# Patient Record
Sex: Male | Born: 1995 | Race: Black or African American | Hispanic: No | Marital: Single | Smoking: Current every day smoker
Health system: Southern US, Community
[De-identification: ages and names within clinical notes are randomized; demographics above are authoritative.]

---

## 2018-03-05 ENCOUNTER — Emergency Department: Payer: Self-pay

## 2018-03-05 ENCOUNTER — Emergency Department
Admission: EM | Admit: 2018-03-05 | Discharge: 2018-03-05 | Disposition: A | Discharge status: Home / Self Care | Payer: Self-pay | Attending: Emergency Medicine | Admitting: Emergency Medicine

## 2018-03-05 ENCOUNTER — Other Ambulatory Visit: Payer: Self-pay

## 2018-03-05 DIAGNOSIS — Y998 Other external cause status: Secondary | ICD-10-CM | POA: Insufficient documentation

## 2018-03-05 DIAGNOSIS — F17200 Nicotine dependence, unspecified, uncomplicated: Secondary | ICD-10-CM | POA: Insufficient documentation

## 2018-03-05 DIAGNOSIS — W109XXA Fall (on) (from) unspecified stairs and steps, initial encounter: Secondary | ICD-10-CM | POA: Insufficient documentation

## 2018-03-05 DIAGNOSIS — Y929 Unspecified place or not applicable: Secondary | ICD-10-CM | POA: Insufficient documentation

## 2018-03-05 DIAGNOSIS — Y9389 Activity, other specified: Secondary | ICD-10-CM | POA: Insufficient documentation

## 2018-03-05 DIAGNOSIS — S99921A Unspecified injury of right foot, initial encounter: Secondary | ICD-10-CM | POA: Insufficient documentation

## 2018-03-05 MED ORDER — KETOROLAC TROMETHAMINE 30 MG/ML IJ SOLN
30.0000 mg | Freq: Once | INTRAMUSCULAR | Status: AC
  Administered 2018-03-05: 30 mg via INTRAMUSCULAR
  Filled 2018-03-05: qty 1

## 2018-03-05 MED ORDER — IBUPROFEN 600 MG PO TABS: 600.0000 mg | ORAL_TABLET | Freq: Four times a day (QID) | ORAL | 0 refills | Status: DC | PRN

## 2018-03-05 NOTE — ED Triage Notes (Signed)
Pt c/o falling downstairs yesterday. Denies LOC. C/o right foot pain

## 2018-03-05 NOTE — ED Provider Notes (Signed)
[facility] Emergency Department Provider Note  ____________________________________________  Time seen: Approximately 2:07 PM  I have reviewed the triage vital signs and the nursing notes.   HISTORY  Chief Complaint Foot Pain    HPI Duane Newton is a 22 y.o. male presents emergency department for evaluation of right ankle pain after fall last night.  Patient states that he missed a step and fell down a couple of stairs.  He landed on his knees.  His right foot went backwards.  He is having pain over the top and side of his ankle.  He has been walking with a boot.  No additional injuries or concerns.   History reviewed. No pertinent past medical history.  There are no active problems to display for this patient.   History reviewed. No pertinent surgical history.  Prior to Admission medications   Medication Sig Start Date End Date Taking? Authorizing Provider  ibuprofen (ADVIL,MOTRIN) 600 MG tablet Take 1 tablet (600 mg total) by mouth every 6 (six) hours as needed. 03/05/18   Enid Derry, PA-C    Allergies Patient has no known allergies.  No family history on file.  Social History Social History   Tobacco Use  . Smoking status: Current Every Day Smoker  Substance Use Topics  . Alcohol use: Yes  . Drug use: Yes    Types: Marijuana     Review of Systems  Constitutional: No fever/chills Cardiovascular: No chest pain. Respiratory:  No SOB. Gastrointestinal: No abdominal pain.  No nausea, no vomiting.  Musculoskeletal: Positive for foot pain.  Skin: Negative for rash, abrasions, lacerations, ecchymosis.  ____________________________________________   PHYSICAL EXAM:  VITAL SIGNS: ED Triage Vitals [03/05/18 1208]  Enc Vitals Group     BP 124/66     Pulse Rate (!) 59     Resp 18     Temp 98.4 F (36.9 C)     Temp src      SpO2 99 %     Weight 165 lb (74.8 kg)     Height 6\' 2"  (1.88 m)     Head Circumference      Peak  Flow      Pain Score 7     Pain Loc      Pain Edu?      Excl. in GC?      Constitutional: Alert and oriented. Well appearing and in no acute distress. Eyes: Conjunctivae are normal. PERRL. EOMI. Head: Atraumatic. ENT:      Ears:      Nose: No congestion/rhinnorhea.      Mouth/Throat: Mucous membranes are moist.  Neck: No stridor. Cardiovascular: Normal rate, regular rhythm.  Good peripheral circulation. Respiratory: Normal respiratory effort without tachypnea or retractions. Lungs CTAB. Good air entry to the bases with no decreased or absent breath sounds. Musculoskeletal: Full range of motion to all extremities. No gross deformities appreciated.  Tenderness to palpation over Neurologic:  Normal speech and language. No gross focal neurologic deficits are appreciated.  Skin:  Skin is warm, dry and intact. No rash noted. Psychiatric: Mood and affect are normal. Speech and behavior are normal. Patient exhibits appropriate insight and judgement.   ____________________________________________   LABS (all labs ordered are listed, but only abnormal results are displayed)  Labs Reviewed - No data to display ____________________________________________  EKG   ____________________________________________  RADIOLOGY Lexine Baton, personally viewed and evaluated these images (plain radiographs) as part of my medical decision making, as well as reviewing the written  report by the radiologist.  Dg Ankle Complete Right  Result Date: 03/05/2018 CLINICAL DATA:  Pt reports falling downstairs yesterday and injuring his foot. Pt states that he has pain all over at this time. EXAM: RIGHT ANKLE - COMPLETE 3+ VIEW COMPARISON:  None. FINDINGS: There is no evidence of fracture, dislocation, or joint effusion. There is no evidence of arthropathy or other focal bone abnormality. No appreciable soft tissue abnormality. IMPRESSION: Negative. If pain persists despite conservative therapy, or if the  patient is unable to bear weight, MRI may be warranted for further characterization. Electronically Signed   By: Gaylyn Rong M.D.   On: 03/05/2018 12:52    ____________________________________________    PROCEDURES  Procedure(s) performed:    Procedures    Medications  ketorolac (TORADOL) 30 MG/ML injection 30 mg (30 mg Intramuscular Given 03/05/18 1418)     ____________________________________________   INITIAL IMPRESSION / ASSESSMENT AND PLAN / ED COURSE  Pertinent labs & imaging results that were available during my care of the patient were reviewed by me and considered in my medical decision making (see chart for details).  Review of the [place] CSRS was performed in accordance of the [facility] prior to dispensing any controlled drugs.     Patient presented to the emergency department for evaluation after foot injury yesterday.  Vital signs and exam are reassuring.  X-ray negative for bony abnormality.  Exam is unremarkable.  Ankle was Ace wrapped.  Crutches were given.  Patient will be discharged home with prescriptions for ibuprofen. Patient is to follow up with PCP as directed. Patient is given ED precautions to return to the ED for any worsening or new symptoms.     ____________________________________________  FINAL CLINICAL IMPRESSION(S) / ED DIAGNOSES  Final diagnoses:  Injury of right foot, initial encounter      NEW MEDICATIONS STARTED DURING THIS VISIT:  ED Discharge Orders         Ordered    ibuprofen (ADVIL,MOTRIN) 600 MG tablet  Every 6 hours PRN     03/05/18 1425              This chart was dictated using voice recognition software/Dragon. Despite best efforts to proofread, errors can occur which can change the meaning. Any change was purely unintentional.    Enid Derry, PA-C 03/05/18 1556    Emily Filbert, MD 03/06/18 2042

## 2018-05-21 ENCOUNTER — Other Ambulatory Visit: Payer: Self-pay

## 2018-05-21 ENCOUNTER — Emergency Department
Admission: EM | Admit: 2018-05-21 | Discharge: 2018-05-21 | Disposition: A | Discharge status: Home / Self Care | Payer: Self-pay | Attending: Emergency Medicine | Admitting: Emergency Medicine

## 2018-05-21 ENCOUNTER — Emergency Department: Payer: Self-pay

## 2018-05-21 ENCOUNTER — Encounter: Payer: Self-pay | Admitting: Emergency Medicine

## 2018-05-21 DIAGNOSIS — Y9289 Other specified places as the place of occurrence of the external cause: Secondary | ICD-10-CM | POA: Insufficient documentation

## 2018-05-21 DIAGNOSIS — S63602A Unspecified sprain of left thumb, initial encounter: Secondary | ICD-10-CM | POA: Insufficient documentation

## 2018-05-21 DIAGNOSIS — Y998 Other external cause status: Secondary | ICD-10-CM | POA: Insufficient documentation

## 2018-05-21 DIAGNOSIS — X500XXA Overexertion from strenuous movement or load, initial encounter: Secondary | ICD-10-CM | POA: Insufficient documentation

## 2018-05-21 DIAGNOSIS — Y9345 Activity, cheerleading: Secondary | ICD-10-CM | POA: Insufficient documentation

## 2018-05-21 MED ORDER — IBUPROFEN 600 MG PO TABS
600.0000 mg | ORAL_TABLET | Freq: Once | ORAL | Status: AC
  Administered 2018-05-21: 600 mg via ORAL
  Filled 2018-05-21: qty 1

## 2018-05-21 MED ORDER — IBUPROFEN 600 MG PO TABS: 600.0000 mg | ORAL_TABLET | Freq: Four times a day (QID) | ORAL | 0 refills | Status: DC | PRN

## 2018-05-21 NOTE — ED Provider Notes (Signed)
[facility] Emergency Department Provider Note  ____________________________________________  Time seen: Approximately 3:28 PM  I have reviewed the triage vital signs and the nursing notes.   HISTORY  Chief Complaint Finger Injury    HPI Duane Newton is a 23 y.o. male that presents to the emergency department for evaluation of right thumb pain.  Patient states that he was at cheer practice yesterday when his left thumb bent backwards when he was trying to catch his teammate.  Pain is primarily over his dorsal thumb.  No additional injuries.  History reviewed. No pertinent past medical history.  There are no active problems to display for this patient.   History reviewed. No pertinent surgical history.  Prior to Admission medications   Medication Sig Start Date End Date Taking? Authorizing Provider  ibuprofen (ADVIL,MOTRIN) 600 MG tablet Take 1 tablet (600 mg total) by mouth every 6 (six) hours as needed. 05/21/18   Enid Derry, PA-C    Allergies Patient has no known allergies.  No family history on file.  Social History Social History   Tobacco Use  . Smoking status: Current Every Day Smoker  Substance Use Topics  . Alcohol use: Yes  . Drug use: Yes    Types: Marijuana     Review of Systems  Gastrointestinal: No nausea, no vomiting.  Musculoskeletal: Positive for thumb pain.  Skin: Negative for rash, abrasions, lacerations, ecchymosis. Neurological: Negative for  numbness or tingling   ____________________________________________   PHYSICAL EXAM:  VITAL SIGNS: ED Triage Vitals [05/21/18 1311]  Enc Vitals Group     BP (!) 142/57     Pulse Rate (!) 44     Resp      Temp 98.6 F (37 C)     Temp Source Oral     SpO2 98 %     Weight 165 lb (74.8 kg)     Height 6\' 2"  (1.88 m)     Head Circumference      Peak Flow      Pain Score      Pain Loc      Pain Edu?      Excl. in GC?      Constitutional: Alert and  oriented. Well appearing and in no acute distress. Eyes: Conjunctivae are normal. PERRL. EOMI. Head: Atraumatic. ENT:      Ears:      Nose: No congestion/rhinnorhea.      Mouth/Throat: Mucous membranes are moist.  Neck: No stridor.  Cardiovascular: Normal rate, regular rhythm.  Good peripheral circulation. Respiratory: Normal respiratory effort without tachypnea or retractions. Lungs CTAB. Good air entry to the bases with no decreased or absent breath sounds. Musculoskeletal: Full range of motion to all extremities. No gross deformities appreciated.  Tenderness to palpation over dorsal IP joint of left thumb.  Full range of motion of left thumb without difficulty. Neurologic:  Normal speech and language. No gross focal neurologic deficits are appreciated.  Skin:  Skin is warm, dry and intact. No rash noted. Psychiatric: Mood and affect are normal. Speech and behavior are normal. Patient exhibits appropriate insight and judgement.   ____________________________________________   LABS (all labs ordered are listed, but only abnormal results are displayed)  Labs Reviewed - No data to display ____________________________________________  EKG   ____________________________________________  RADIOLOGY Lexine Baton, personally viewed and evaluated these images (plain radiographs) as part of my medical decision making, as well as reviewing the written report by the radiologist.  Dg Finger Thumb Left  Result Date: 05/21/2018 CLINICAL DATA:  Injury while cheerleading EXAM: LEFT THUMB 2+V COMPARISON:  None. FINDINGS: Frontal, oblique, and lateral views obtained. No fracture or dislocation. Joint spaces appear normal. No erosive change. IMPRESSION: No fracture or dislocation.  No evident arthropathy. Electronically Signed   By: Bretta BangWilliam  Woodruff III M.D.   On: 05/21/2018 14:00    ____________________________________________    PROCEDURES  Procedure(s) performed:     Procedures    Medications  ibuprofen (ADVIL,MOTRIN) tablet 600 mg (600 mg Oral Given 05/21/18 1558)     ____________________________________________   INITIAL IMPRESSION / ASSESSMENT AND PLAN / ED COURSE  Pertinent labs & imaging results that were available during my care of the patient were reviewed by me and considered in my medical decision making (see chart for details).  Review of the [place] CSRS was performed in accordance of the [facility] prior to dispensing any controlled drugs.     Patient's diagnosis is consistent with thumb sprain.  Vital signs and exam are reassuring.  No acute bony abnormalities on x-ray.  Exam is unremarkable patient will be discharged home with prescriptions for ibuprofen. Patient is to follow up with primary care as directed. Patient is given ED precautions to return to the ED for any worsening or new symptoms.     ____________________________________________  FINAL CLINICAL IMPRESSION(S) / ED DIAGNOSES  Final diagnoses:  Sprain of left thumb, unspecified site of finger, initial encounter      NEW MEDICATIONS STARTED DURING THIS VISIT:  ED Discharge Orders         Ordered    ibuprofen (ADVIL,MOTRIN) 600 MG tablet  Every 6 hours PRN     05/21/18 1553              This chart was dictated using voice recognition software/Dragon. Despite best efforts to proofread, errors can occur which can change the meaning. Any change was purely unintentional. -   Enid DerryWagner, Daily Crate, PA-C 05/21/18 1736    Arnaldo NatalMalinda, Paul F, MD 05/26/18 [phone]

## 2018-05-21 NOTE — ED Triage Notes (Signed)
PT arrives with complaints of left thumb injury that happened yesterday. Mild swelling noted to left thumb, no obvious deformity.

## 2018-05-21 NOTE — ED Notes (Signed)
See triage note  States he was trying to catch a cheerleader  And bent back left thumb  Positive swelling

## 2018-09-30 ENCOUNTER — Emergency Department
Admission: EM | Admit: 2018-09-30 | Discharge: 2018-10-01 | Disposition: A | Discharge status: Home / Self Care | Payer: Self-pay | Attending: Emergency Medicine | Admitting: Emergency Medicine

## 2018-09-30 ENCOUNTER — Other Ambulatory Visit: Payer: Self-pay

## 2018-09-30 DIAGNOSIS — K2900 Acute gastritis without bleeding: Secondary | ICD-10-CM | POA: Insufficient documentation

## 2018-09-30 DIAGNOSIS — Z79899 Other long term (current) drug therapy: Secondary | ICD-10-CM | POA: Insufficient documentation

## 2018-09-30 DIAGNOSIS — F17200 Nicotine dependence, unspecified, uncomplicated: Secondary | ICD-10-CM | POA: Insufficient documentation

## 2018-09-30 DIAGNOSIS — R197 Diarrhea, unspecified: Secondary | ICD-10-CM | POA: Insufficient documentation

## 2018-09-30 DIAGNOSIS — R112 Nausea with vomiting, unspecified: Secondary | ICD-10-CM

## 2018-09-30 LAB — CBC
HCT: 43.9 % (ref 39.0–52.0)
Hemoglobin: 15.5 g/dL (ref 13.0–17.0)
MCH: 29.9 pg (ref 26.0–34.0)
MCHC: 35.3 g/dL (ref 30.0–36.0)
MCV: 84.6 fL (ref 80.0–100.0)
Platelets: 227 10*3/uL (ref 150–400)
RBC: 5.19 MIL/uL (ref 4.22–5.81)
RDW: 12.3 % (ref 11.5–15.5)
WBC: 15.6 10*3/uL — ABNORMAL HIGH (ref 4.0–10.5)
nRBC: 0 % (ref 0.0–0.2)

## 2018-09-30 LAB — COMPREHENSIVE METABOLIC PANEL
ALT: 32 U/L (ref 0–44)
AST: 31 U/L (ref 15–41)
Albumin: 5.3 g/dL — ABNORMAL HIGH (ref 3.5–5.0)
Alkaline Phosphatase: 74 U/L (ref 38–126)
Anion gap: 12 (ref 5–15)
BUN: 21 mg/dL — ABNORMAL HIGH (ref 6–20)
CO2: 26 mmol/L (ref 22–32)
Calcium: 10.3 mg/dL (ref 8.9–10.3)
Chloride: 100 mmol/L (ref 98–111)
Creatinine, Ser: 1.02 mg/dL (ref 0.61–1.24)
GFR calc Af Amer: >60 mL/min (ref >60)
GFR calc non Af Amer: >60 mL/min (ref >60)
Glucose, Bld: 85 mg/dL (ref 70–99)
Potassium: 4.4 mmol/L (ref 3.5–5.1)
Sodium: 138 mmol/L (ref 135–145)
Total Bilirubin: 1.2 mg/dL (ref 0.3–1.2)
Total Protein: 9.1 g/dL — ABNORMAL HIGH (ref 6.5–8.1)

## 2018-09-30 LAB — URINALYSIS, COMPLETE (UACMP) WITH MICROSCOPIC
Bacteria, UA: NONE SEEN
Bilirubin Urine: NEGATIVE
Glucose, UA: NEGATIVE mg/dL
Hgb urine dipstick: NEGATIVE
Ketones, ur: 80 mg/dL — AB
Leukocytes,Ua: NEGATIVE
Nitrite: NEGATIVE
Protein, ur: NEGATIVE mg/dL
Specific Gravity, Urine: 1.023 (ref 1.005–1.030)
Squamous Epithelial / LPF: NONE SEEN (ref 0–5)
pH: 5 (ref 5.0–8.0)

## 2018-09-30 LAB — LIPASE, BLOOD: Lipase: 23 U/L (ref 11–51)

## 2018-09-30 MED ORDER — ONDANSETRON HCL 4 MG/2ML IJ SOLN
4.0000 mg | Freq: Once | INTRAMUSCULAR | Status: AC
  Administered 2018-10-01: 4 mg via INTRAVENOUS
  Filled 2018-09-30: qty 2

## 2018-09-30 MED ORDER — FAMOTIDINE IN NACL 20-0.9 MG/50ML-% IV SOLN
20.0000 mg | Freq: Once | INTRAVENOUS | Status: AC
  Administered 2018-10-01: 20 mg via INTRAVENOUS
  Filled 2018-09-30: qty 50

## 2018-09-30 MED ORDER — SODIUM CHLORIDE 0.9% FLUSH
3.0000 mL | Freq: Once | INTRAVENOUS | Status: AC
  Administered 2018-10-01: 3 mL via INTRAVENOUS

## 2018-09-30 MED ORDER — DEXTROSE-NACL 5-0.45 % IV SOLN
Freq: Once | INTRAVENOUS | Status: AC
  Administered 2018-10-01: 01:00:00 via INTRAVENOUS

## 2018-09-30 NOTE — ED Triage Notes (Signed)
Patient reports abdominal pain with nausea and vomiting since this morning.  Last vomited 2 hours prior to arrival.

## 2018-09-30 NOTE — ED Notes (Signed)
Before pt checked in his boyfriend came up to the desk to let us know pt was in the bathroom but no sure if he's going to check in; boyfriend reports pt may have alcohol poisoning; pt ambulatory with steady gait to registration when finished in the restroom;

## 2018-09-30 NOTE — ED Notes (Signed)
Pt given warm blankets.

## 2018-09-30 NOTE — ED Provider Notes (Signed)
[facility] Emergency Department Provider Note   ____________________________________________   First MD Initiated Contact with Patient 09/30/18 2331     (approximate)  I have reviewed the triage vital signs and the nursing notes.   HISTORY  Chief Complaint Abdominal Pain and Emesis    HPI Ambros Hafele is a 23 y.o. male who presents with a 1 day history of nausea/vomiting/diarrhea after a night of heavy drinking.  Complains of abdominal cramps only on vomiting.  Denies recent fever, cough, chest pain, shortness of breath, dysuria.  Denies recent travel, trauma or exposure to persons diagnosed with coronavirus.       Past medical history None  There are no active problems to display for this patient.   No past surgical history on file.  Prior to Admission medications   Medication Sig Start Date End Date Taking? Authorizing Provider  famotidine (PEPCID) 20 MG tablet Take 1 tablet (20 mg total) by mouth 2 (two) times daily. 10/01/18   Irean Hong, MD  ibuprofen (ADVIL,MOTRIN) 600 MG tablet Take 1 tablet (600 mg total) by mouth every 6 (six) hours as needed. 05/21/18   Enid Derry, PA-C  ondansetron (ZOFRAN ODT) 4 MG disintegrating tablet Take 1 tablet (4 mg total) by mouth every 8 (eight) hours as needed for nausea or vomiting. 10/01/18   Irean Hong, MD    Allergies Patient has no known allergies.  No family history on file.  Social History Social History   Tobacco Use  . Smoking status: Current Every Day Smoker  Substance Use Topics  . Alcohol use: Yes  . Drug use: Yes    Types: Marijuana    Review of Systems  Constitutional: No fever/chills Eyes: No visual changes. ENT: No sore throat. Cardiovascular: Denies chest pain. Respiratory: Denies shortness of breath. Gastrointestinal: No abdominal pain.  Positive for nausea, vomiting and diarrhea.  No constipation. Genitourinary: Negative for dysuria. Musculoskeletal: Negative  for back pain. Skin: Negative for rash. Neurological: Negative for headaches, focal weakness or numbness.   ____________________________________________   PHYSICAL EXAM:  VITAL SIGNS: ED Triage Vitals  Enc Vitals Group     BP 09/30/18 2303 (!) 145/63     Pulse Rate 09/30/18 2303 91     Resp 09/30/18 2303 18     Temp 09/30/18 2303 97.9 F (36.6 C)     Temp Source 09/30/18 2303 Oral     SpO2 09/30/18 2303 98 %     Weight 09/30/18 2048 175 lb (79.4 kg)     Height 09/30/18 2048 6\' 2"  (1.88 m)     Head Circumference --      Peak Flow --      Pain Score 09/30/18 2048 9     Pain Loc --      Pain Edu? --      Excl. in GC? --     Constitutional: Alert and oriented. Well appearing and in no acute distress. Eyes: Conjunctivae are normal. PERRL. EOMI. Head: Atraumatic. Nose: No congestion/rhinnorhea. Mouth/Throat: Mucous membranes are mildly dry.  Oropharynx non-erythematous. Neck: No stridor.   Cardiovascular: Normal rate, regular rhythm. Grossly normal heart sounds.  Good peripheral circulation. Respiratory: Normal respiratory effort.  No retractions. Lungs CTAB. Gastrointestinal: Soft and minimally tender to palpation epigastrium without rebound or guarding. No distention. No abdominal bruits. No CVA tenderness. Musculoskeletal: No lower extremity tenderness nor edema.  No joint effusions. Neurologic:  Normal speech and language. No gross focal neurologic deficits are appreciated. No gait  instability. Skin:  Skin is warm, dry and intact. No rash noted. Psychiatric: Mood and affect are normal. Speech and behavior are normal.  ____________________________________________   LABS (all labs ordered are listed, but only abnormal results are displayed)  Labs Reviewed  COMPREHENSIVE METABOLIC PANEL - Abnormal; Notable for the following components:      Result Value   BUN 21 (*)    Total Protein 9.1 (*)    Albumin 5.3 (*)    All other components within normal limits  CBC -  Abnormal; Notable for the following components:   WBC 15.6 (*)    All other components within normal limits  URINALYSIS, COMPLETE (UACMP) WITH MICROSCOPIC - Abnormal; Notable for the following components:   Color, Urine YELLOW (*)    APPearance CLEAR (*)    Ketones, ur 80 (*)    All other components within normal limits  LIPASE, BLOOD   ____________________________________________  EKG  None ____________________________________________  RADIOLOGY  ED MD interpretation: None  Official radiology report(s): No results found.  ____________________________________________   PROCEDURES  Procedure(s) performed (including Critical Care):  Procedures   ____________________________________________   INITIAL IMPRESSION / ASSESSMENT AND PLAN / ED COURSE  As part of my medical decision making, I reviewed the following data within the electronic MEDICAL RECORD NUMBER Nursing notes reviewed and incorporated, Labs reviewed, Old chart reviewed and Notes from prior ED visits     Wallis BambergDarian Flitton was evaluated in Emergency Department on 10/01/2018 for the symptoms described in the history of present illness. He was evaluated in the context of the global COVID-19 pandemic, which necessitated consideration that the patient might be at risk for infection with the SARS-CoV-2 virus that causes COVID-19. Institutional protocols and algorithms that pertain to the evaluation of patients at risk for COVID-19 are in a state of rapid change based on information released by regulatory bodies including the CDC and federal and state organizations. These policies and algorithms were followed during the patient's care in the ED.   23 year old male who presents with nausea/vomiting/diarrhea after a night of heavy drinking.  Differential diagnosis includes but is not limited to gastritis, GERD, pancreatitis, etc.  Laboratory results remarkable for moderate leukocytosis, ketonuria.  Will initiate IV fluid  resuscitation, IV Protonix, Zofran and reassess.   Clinical Course as of Sep 30 348  Mon Oct 01, 2018  0110 Patient is feeling significantly better.  May be discharged after completion of IV fluids.  Strict return precautions given.  Patient verbalizes understanding agrees with plan of care.   [JS]    Clinical Course User Index [JS] Irean HongSung, Jade J, MD     ____________________________________________   FINAL CLINICAL IMPRESSION(S) / ED DIAGNOSES  Final diagnoses:  Acute gastritis without hemorrhage, unspecified gastritis type  Nausea vomiting and diarrhea     ED Discharge Orders         Ordered    famotidine (PEPCID) 20 MG tablet  2 times daily     10/01/18 0110    ondansetron (ZOFRAN ODT) 4 MG disintegrating tablet  Every 8 hours PRN     10/01/18 0110           Note:  This document was prepared using Dragon voice recognition software and may include unintentional dictation errors.   Irean HongSung, Jade J, MD 10/01/18 [phone]

## 2018-09-30 NOTE — ED Notes (Signed)
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 Denies fever at home.

## 2018-09-30 NOTE — ED Notes (Addendum)
Pt was in the bathroom and has returned to triage area; pt understands he will be next for triage

## 2018-10-01 MED ORDER — ONDANSETRON 4 MG PO TBDP: 4.0000 mg | ORAL_TABLET | Freq: Three times a day (TID) | ORAL | 0 refills | Status: DC | PRN

## 2018-10-01 MED ORDER — FAMOTIDINE 20 MG PO TABS: 20.0000 mg | ORAL_TABLET | Freq: Two times a day (BID) | ORAL | 0 refills | Status: DC

## 2018-10-01 NOTE — ED Notes (Signed)
D5 & 1/2 will be hung once Pepcid finished. Per micromedex not tested for compatibility with Pepcid.

## 2018-10-01 NOTE — Discharge Instructions (Signed)
1.  Take Pepcid 20 mg twice daily. 2.  You may take Zofran as needed for nausea/vomiting. 3.  Clear liquids x12 hours, then [employer] x3 days, slowly advance diet as tolerated. 4.  Return to the ER for worsening symptoms, persistent vomiting, difficulty breathing or other concerns.

## 2018-10-01 NOTE — ED Notes (Signed)
Pt given cup of water 

## 2018-10-01 NOTE — ED Notes (Signed)
EDP Sung verbal okay with pt d/c only having received 600cc of D5 & 1/2.

## 2018-10-01 NOTE — ED Notes (Signed)
Pt denies any needs. Resting in bed. Call bell within reach. Fluids 1/4th completed.

## 2019-04-28 IMAGING — DX DG FINGER THUMB 2+V*L*
3 series · 3 of 3 positions shown · non-contrast
Comparison: None.

CLINICAL DATA: Injury while cheerleading

EXAM:
LEFT THUMB 2+V

[finger ap]
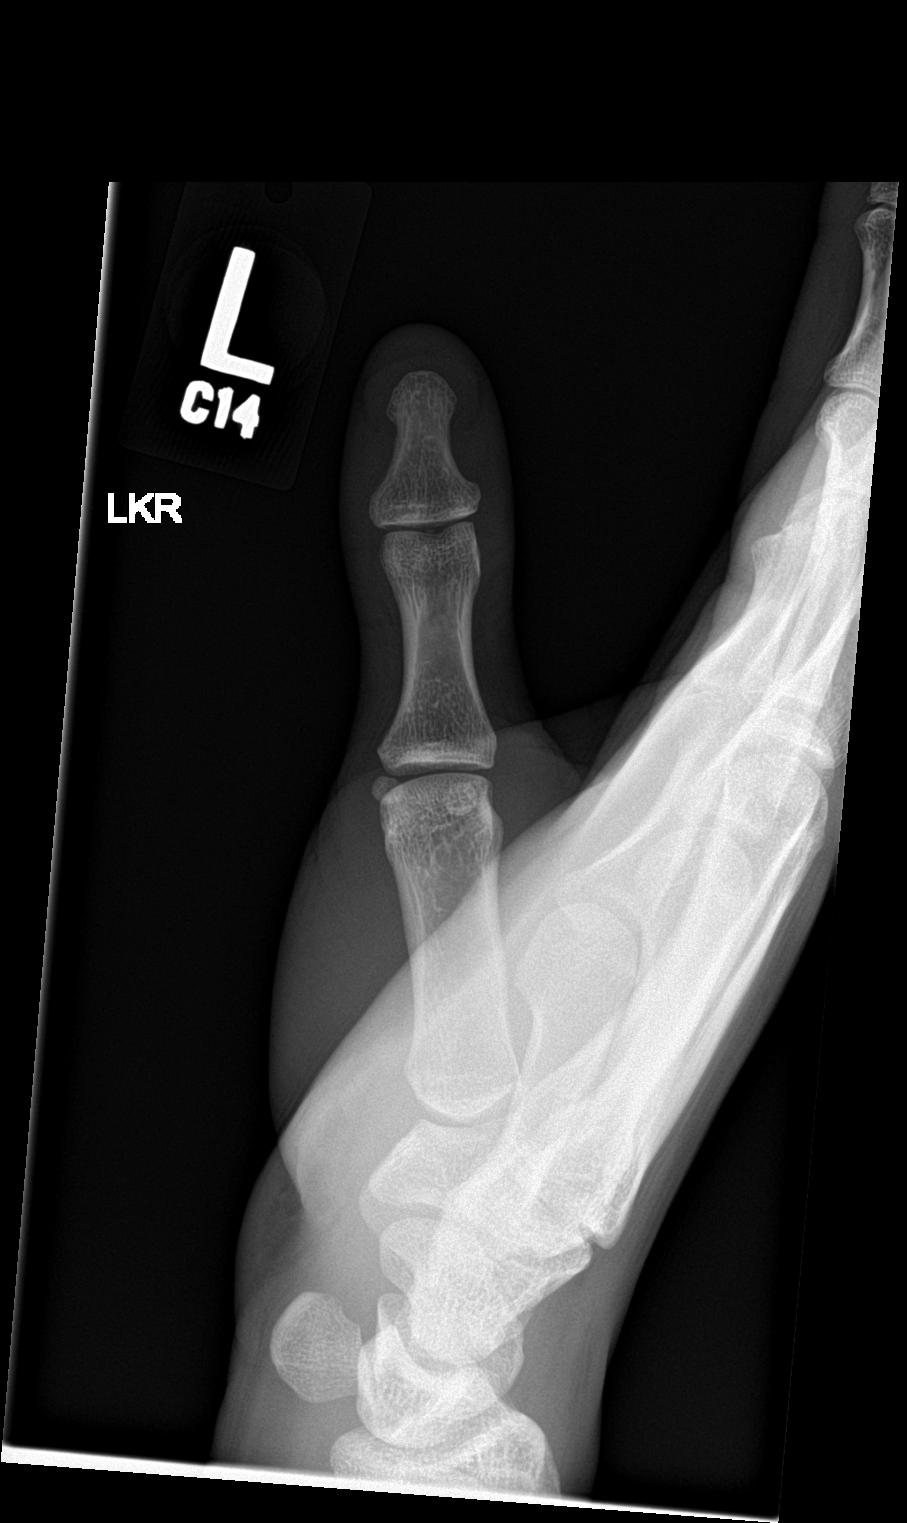

[finger obl]
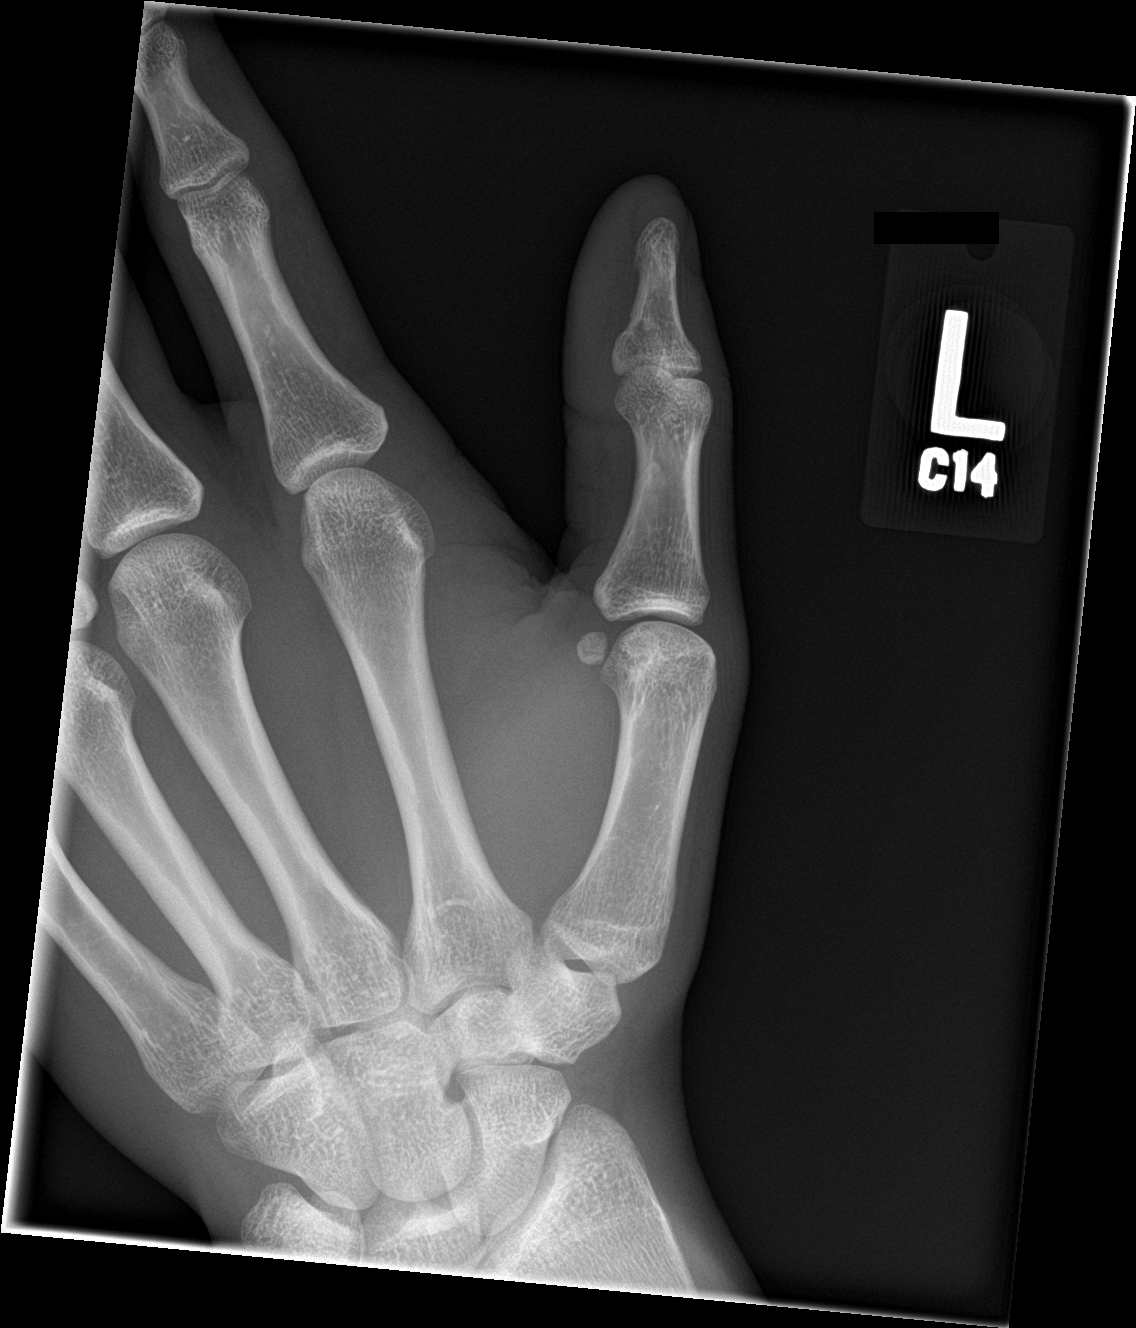

[finger lat]
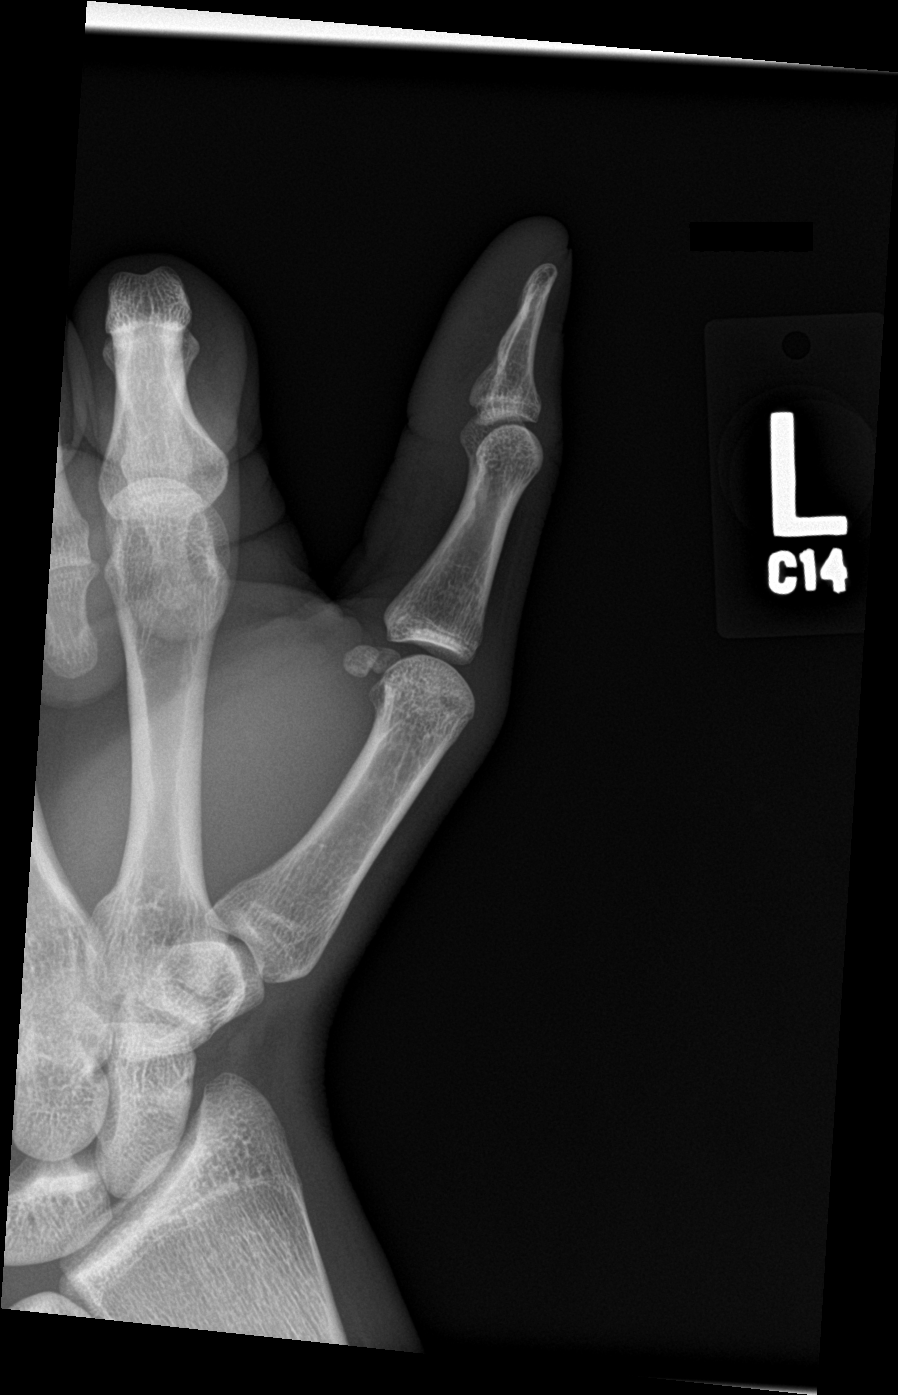

[3 of 3 positions shown; findings below may reference images not displayed]

FINDINGS: Frontal, oblique, and lateral views obtained. No fracture or
dislocation. Joint spaces appear normal. No erosive change.
IMPRESSION: No fracture or dislocation.  No evident arthropathy.

## 2022-11-06 ENCOUNTER — Emergency Department: Payer: Self-pay

## 2022-11-06 ENCOUNTER — Emergency Department
Admission: EM | Admit: 2022-11-06 | Discharge: 2022-11-06 | Disposition: A | Discharge status: Home / Self Care | Payer: Self-pay | Attending: Emergency Medicine | Admitting: Emergency Medicine

## 2022-11-06 ENCOUNTER — Other Ambulatory Visit: Payer: Self-pay

## 2022-11-06 ENCOUNTER — Encounter: Payer: Self-pay | Admitting: Emergency Medicine

## 2022-11-06 DIAGNOSIS — M79672 Pain in left foot: Secondary | ICD-10-CM | POA: Insufficient documentation

## 2022-11-06 MED ORDER — IBUPROFEN 800 MG PO TABS: 800.0000 mg | ORAL_TABLET | Freq: Three times a day (TID) | ORAL | 0 refills | Status: AC | PRN

## 2022-11-06 NOTE — ED Triage Notes (Signed)
LEFT ankle injury while playing basketball yesterday; No obvious deformity, CNS in tact

## 2022-11-06 NOTE — Discharge Instructions (Addendum)
Do not stand or walk for long periods. Do stretches to relieve foot pain and stiffness Apply ice to the effected area. Keep limb elevated. Use ACE bandage wrap as compression to help with swelling.   Use crutches for assistance and weight bear as tolerated.  Please go to the following website to schedule new (and existing) patient appointments:   [email or web address]   The following is a list of primary care offices in the area who are accepting new patients at this time.  Please reach out to one of them directly and let them know you would like to schedule an appointment to follow up on an Emergency Department visit, and/or to establish a new primary care provider (PCP).  There are likely other primary care clinics in the are who are accepting new patients, but this is an excellent place to start:  [facility] Lead physician: Dr Shirlee Latch [address] [place], [place] [address] [phone]  [facility] Lead Physician: Dr Alba Cory [address], [place], [place] [address] [phone]  [facility]  Lead Physician: Dr Olevia Perches [address] [place], [place] [address] [phone]  [facility] Lead Physician: Dr Sofie Hartigan [address] [place], [place] [address] [phone]  [facility] Lead Physician: Dr Bari Edward [address] [place], [place], [place] [address] [phone]

## 2022-11-06 NOTE — ED Notes (Signed)
Pt A&O x4, no obvious distress noted, respirations regular/unlabored. Pt verbalizes understanding of discharge instructions. Pt able to ambulate from ED independently.   

## 2022-11-06 NOTE — ED Provider Notes (Signed)
[facility] Emergency Department Provider Note     Event Date/Time   First MD Initiated Contact with Patient 11/06/22 1159     (approximate)   History   Ankle Injury (LEFT ankle injury while playing basketball yesterday; No obvious deformity, CNS in tact)   HPI  Duane Newton is a 27 y.o. male who presents to the emergency department for complaint of left ankle pain that occurred last night at an "event".  Patient reports he rolled his left foot and immediately experienced a sharp pain to his ankle. Patient is able to ambulate with pain. Patient has a surgical history on same foot 8 years ago.    Denies numbness and tingling.     Physical Exam   Triage Vital Signs: ED Triage Vitals  Enc Vitals Group     BP 11/06/22 1156 (!) 144/85     Pulse Rate 11/06/22 1156 80     Resp 11/06/22 1156 12     Temp 11/06/22 1156 98.2 F (36.8 C)     Temp Source 11/06/22 1156 Oral     SpO2 11/06/22 1156 97 %     Weight 11/06/22 1200 175 lb 0.7 oz (79.4 kg)     Height 11/06/22 1157 6\' 2"  (1.88 m)     Head Circumference --      Peak Flow --      Pain Score --      Pain Loc --      Pain Edu? --      Excl. in GC? --     Most recent vital signs: Vitals:   11/06/22 1156  BP: (!) 144/85  Pulse: 80  Resp: 12  Temp: 98.2 F (36.8 C)  SpO2: 97%    General Awake, no distress.  HEENT NCAT. PERRL. EOMI.  CV:  Good peripheral perfusion.  RESP:  Normal effort.  ABD:  No distention.  Other:  Left foot and ankle reveals no visible deformities.  No ecchymosis.  Absence of tenderness over medial malleolus.  Full range of motion.  Complete plantar and dorsi flexion without difficulty.  Full AROM. Neurovascular status intact all throughout.  DP palpated bilaterally equal, regular rhythm.  Capillary refill brisk.   ED Results / Procedures / Treatments   Labs (all labs ordered are listed, but only abnormal results are displayed) Labs Reviewed - No data to  display  RADIOLOGY I personally viewed and evaluated these images as part of my medical decision making, as well as reviewing the written report by the radiologist.  ED Provider Interpretation: Left ankle x-ray reveals no evidence of fracture or dislocation.  DG Ankle Complete Left  Result Date: 11/06/2022 CLINICAL DATA:  Ankle pain after twisting injury EXAM: LEFT ANKLE COMPLETE - 3 VIEW COMPARISON:  None Available. FINDINGS: There is no evidence of fracture, dislocation, or joint effusion. There is no evidence of arthropathy or other focal bone abnormality. Soft tissues are unremarkable. IMPRESSION: Negative. Electronically Signed   By: Jacob Moores M.D.   On: 11/06/2022 12:15    PROCEDURES:  Critical Care performed: No  Procedures   MEDICATIONS ORDERED IN ED: Medications - No data to display   IMPRESSION / MDM / ASSESSMENT AND PLAN / ED COURSE  I reviewed the triage vital signs and the nursing notes.                               27 y.o. male  presents to the emergency department for evaluation and treatment of acute left ankle injury. See HPI for further details. Vital signs and physical exam are unremarkable.  Differential diagnosis includes, but is not limited to fracture, dislocation, ligamentous injury, ankle sprain, contusion.  Imaging ordered and reviewed.  Left ankle x-ray is reassuring.  Physical exam is reassuring. Patient was placed in a Ace bandage wrap for compression will use crutches for assistance ambulation and bear weight as tolerated.  Patient has to follow-up with orthopedic in 1 week if symptoms do not improve.  Patient will be discharged home with prescription for ibuprofen as needed. Patient is given ED precautions to return to the ED for any worsening or new symptoms. Patient verbalizes understanding. All questions and concerns were addressed during ED visit.    Patient's presentation is most consistent with acute complicated illness / injury requiring  diagnostic workup.  FINAL CLINICAL IMPRESSION(S) / ED DIAGNOSES   Final diagnoses:  Acute pain of left foot     Rx / DC Orders   ED Discharge Orders          Ordered    ibuprofen (ADVIL) 800 MG tablet  Every 8 hours PRN        11/06/22 1242             Note:  This document was prepared using Dragon voice recognition software and may include unintentional dictation errors.    Romeo Apple, Donnisha Besecker A, PA-C 11/06/22 1742    Trinna Post, MD 11/06/22 [phone]

## 2022-11-06 NOTE — ED Notes (Signed)
Crutches provided to pt, pt verbalizes understanding of education. Pt also able to demonstrate proper utilization.
